# Patient Record
Sex: Female | Born: 1995 | Race: Black or African American | Hispanic: No | Marital: Single | Smoking: Never smoker
Health system: Southern US, Community
[De-identification: ages and names within clinical notes are randomized; demographics above are authoritative.]

## PROBLEM LIST (undated history)

## (undated) DIAGNOSIS — N939 Abnormal uterine and vaginal bleeding, unspecified: Secondary | ICD-10-CM

## (undated) DIAGNOSIS — R519 Headache, unspecified: Secondary | ICD-10-CM

## (undated) DIAGNOSIS — R51 Headache: Secondary | ICD-10-CM

## (undated) DIAGNOSIS — F419 Anxiety disorder, unspecified: Secondary | ICD-10-CM

## (undated) HISTORY — DX: Headache: R51

## (undated) HISTORY — DX: Abnormal uterine and vaginal bleeding, unspecified: N93.9

## (undated) HISTORY — DX: Headache, unspecified: R51.9

## (undated) HISTORY — DX: Anxiety disorder, unspecified: F41.9

---
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
## 2015-09-08 ENCOUNTER — Encounter: Payer: Self-pay | Admitting: Certified Nurse Midwife

## 2015-09-08 ENCOUNTER — Ambulatory Visit (INDEPENDENT_AMBULATORY_CARE_PROVIDER_SITE_OTHER): Payer: BLUE CROSS/BLUE SHIELD | Admitting: Certified Nurse Midwife

## 2015-09-08 VITALS — BP 112/64 | HR 84 | Temp 98.7°F | Resp 16 | Ht 67.5 in | Wt 294.4 lb

## 2015-09-08 DIAGNOSIS — N938 Other specified abnormal uterine and vaginal bleeding: Secondary | ICD-10-CM

## 2015-09-08 DIAGNOSIS — N912 Amenorrhea, unspecified: Secondary | ICD-10-CM | POA: Diagnosis not present

## 2015-09-08 DIAGNOSIS — E669 Obesity, unspecified: Secondary | ICD-10-CM | POA: Diagnosis not present

## 2015-09-08 DIAGNOSIS — Z01419 Encounter for gynecological examination (general) (routine) without abnormal findings: Secondary | ICD-10-CM | POA: Diagnosis not present

## 2015-09-08 DIAGNOSIS — Z0000 Encounter for general adult medical examination without abnormal findings: Secondary | ICD-10-CM | POA: Diagnosis not present

## 2015-09-08 LAB — POCT URINALYSIS DIP (MANUAL ENTRY)
Bilirubin, UA: NEGATIVE
Glucose, UA: NEGATIVE
Ketones, POC UA: NEGATIVE
Leukocytes, UA: NEGATIVE
Nitrite, UA: NEGATIVE
Protein Ur, POC: NEGATIVE
Urobilinogen, UA: NEGATIVE
pH, UA: 5

## 2015-09-08 NOTE — Patient Instructions (Signed)
General topics  Next pap or exam is  due in 1 year Take a Women's multivitamin Take 1200 mg. of calcium daily - prefer dietary If any concerns in interim to call back  Breast Self-Awareness Practicing breast self-awareness may pick up problems early, prevent significant medical complications, and possibly save your life. By practicing breast self-awareness, you can become familiar with how your breasts look and feel and if your breasts are changing. This allows you to notice changes early. It can also offer you some reassurance that your breast health is good. One way to learn what is normal for your breasts and whether your breasts are changing is to do a breast self-exam. If you find a lump or something that was not present in the past, it is best to contact your caregiver right away. Other findings that should be evaluated by your caregiver include nipple discharge, especially if it is bloody; skin changes or reddening; areas where the skin seems to be pulled in (retracted); or new lumps and bumps. Breast pain is seldom associated with cancer (malignancy), but should also be evaluated by a caregiver. BREAST SELF-EXAM The best time to examine your breasts is 5 7 days after your menstrual period is over.  ExitCare Patient Information 2013 [employer]Care[employer].   Exercise to Stay Healthy Exercise helps you become and stay healthy. EXERCISE IDEAS AND TIPS Choose exercises that:  You enjoy.  Fit into your day. You do not need to exercise really hard to be healthy. You can do exercises at a slow or medium level and stay healthy. You can:  Stretch before and after working out.  Try yoga, Pilates, or tai chi.  Lift weights.  Walk fast, swim, jog, run, climb stairs, bicycle, dance, or rollerskate.  Take aerobic classes. Exercises that burn about 150 calories:  Running 1  miles in 15 minutes.  Playing volleyball for 45 to 60 minutes.  Washing and waxing a car for 45 to 60  minutes.  Playing touch football for 45 minutes.  Walking 1  miles in 35 minutes.  Pushing a stroller 1  miles in 30 minutes.  Playing basketball for 30 minutes.  Raking leaves for 30 minutes.  Bicycling 5 miles in 30 minutes.  Walking 2 miles in 30 minutes.  Dancing for 30 minutes.  Shoveling snow for 15 minutes.  Swimming laps for 20 minutes.  Walking up stairs for 15 minutes.  Bicycling 4 miles in 15 minutes.  Gardening for 30 to 45 minutes.  Jumping rope for 15 minutes.  Washing windows or floors for 45 to 60 minutes. Document Released: 10/12/2010 Document Revised: 12/02/2011 Document Reviewed: 10/12/2010 ExitCare Patient Information 2013 ExitCare[employer].   Other topics ( that may be useful information):    Sexually Transmitted Disease Sexually transmitted disease (STD) refers to any infection that is passed from person to person during sexual activity. This may happen by way of saliva, semen, blood, vaginal mucus, or urine. Common STDs include:  Gonorrhea.  Chlamydia.  Syphilis.  HIV/AIDS.  Genital herpes.  Hepatitis B and C.  Trichomonas.  Human papillomavirus (HPV).  Pubic lice. CAUSES  An STD may be spread by bacteria, virus, or parasite. A person can get an STD by:  Sexual intercourse with an infected person.  Sharing sex toys with an infected person.  Sharing needles with an infected person.  Having intimate contact with the genitals, mouth, or rectal areas of an infected person. SYMPTOMS  Some people may not have any symptoms, but   they can still pass the infection to others. Different STDs have different symptoms. Symptoms include:  Painful or bloody urination.  Pain in the pelvis, abdomen, vagina, anus, throat, or eyes.  Skin rash, itching, irritation, growths, or sores (lesions). These usually occur in the genital or anal area.  Abnormal vaginal discharge.  Penile discharge in men.  Soft, flesh-colored skin growths in the  genital or anal area.  Fever.  Pain or bleeding during sexual intercourse.  Swollen glands in the groin area.  Yellow skin and eyes (jaundice). This is seen with hepatitis. DIAGNOSIS  To make a diagnosis, your caregiver may:  Take a medical history.  Perform a physical exam.  Take a specimen (culture) to be examined.  Examine a sample of discharge under a microscope.  Perform blood test TREATMENT   Chlamydia, gonorrhea, trichomonas, and syphilis can be cured with antibiotic medicine.  Genital herpes, hepatitis, and HIV can be treated, but not cured, with prescribed medicines. The medicines will lessen the symptoms.  Genital warts from HPV can be treated with medicine or by freezing, burning (electrocautery), or surgery. Warts may come back.  HPV is a virus and cannot be cured with medicine or surgery.However, abnormal areas may be followed very closely by your caregiver and may be removed from the cervix, vagina, or vulva through office procedures or surgery. If your diagnosis is confirmed, your recent sexual partners need treatment. This is true even if they are symptom-free or have a negative culture or evaluation. They should not have sex until their caregiver says it is okay. HOME CARE INSTRUCTIONS  All sexual partners should be informed, tested, and treated for all STDs.  Take your antibiotics as directed. Finish them even if you start to feel better.  Only take over-the-counter or prescription medicines for pain, discomfort, or fever as directed by your caregiver.  Rest.  Eat a balanced diet and drink enough fluids to keep your urine clear or pale yellow.  Do not have sex until treatment is completed and you have followed up with your caregiver. STDs should be checked after treatment.  Keep all follow-up appointments, Pap tests, and blood tests as directed by your caregiver.  Only use latex condoms and water-soluble lubricants during sexual activity. Do not use  petroleum jelly or oils.  Avoid alcohol and illegal drugs.  Get vaccinated for HPV and hepatitis. If you have not received these vaccines in the past, talk to your caregiver about whether one or both might be right for you.  Avoid risky sex practices that can break the skin. The only way to avoid getting an STD is to avoid all sexual activity.Latex condoms and dental dams (for oral sex) will help lessen the risk of getting an STD, but will not completely eliminate the risk. SEEK MEDICAL CARE IF:   You have a fever.  You have any new or worsening symptoms. Document Released: 11/30/2002 Document Revised: 12/02/2011 Document Reviewed: 12/07/2010 [facility] Patient Information 2013 [place].    Domestic Abuse You are being battered or abused if someone close to you hits, pushes, or physically hurts you in any way. You also are being abused if you are forced into activities. You are being sexually abused if you are forced to have sexual contact of any kind. You are being emotionally abused if you are made to feel worthless or if you are constantly threatened. It is important to remember that help is available. No one has the right to abuse you. PREVENTION OF FURTHER  ABUSE  Learn the warning signs of danger. This varies with situations but may include: the use of alcohol, threats, isolation from friends and family, or forced sexual contact. Leave if you feel that violence is going to occur.  If you are attacked or beaten, report it to the police so the abuse is documented. You do not have to press charges. The police can protect you while you or the attackers are leaving. Get the officer's name and badge number and a copy of the report.  Find someone you can trust and tell them what is happening to you: your caregiver, a nurse, clergy member, close friend or family member. Feeling ashamed is natural, but remember that you have done nothing wrong. No one deserves abuse. Document Released:  09/06/2000 Document Revised: 12/02/2011 Document Reviewed: 11/15/2010 ExitCare Patient Information 2013 [employer]Care[employer] LLC.    How Much is Too Much Alcohol? Drinking too much alcohol can cause injury, accidents, and health problems. These types of problems can include:   Car crashes.  Falls.  Family fighting (domestic violence).  Drowning.  Fights.  Injuries.  Burns.  Damage to certain organs.  Having a baby with birth defects. ONE DRINK CAN BE TOO MUCH WHEN YOU ARE:  Working.  Pregnant or breastfeeding.  Taking medicines. Ask your doctor.  Driving or planning to drive. If you or someone you know has a drinking problem, get help from a doctor.  Document Released: 07/06/2009 Document Revised: 12/02/2011 Document Reviewed: 07/06/2009 ExitCare Patient Information 2013 [employer]Care[employer].   Smoking Hazards Smoking cigarettes is extremely bad for your health. Tobacco smoke has over 200 known poisons in it. There are over 60 chemicals in tobacco smoke that cause cancer. Some of the chemicals found in cigarette smoke include:   Cyanide.  Benzene.  Formaldehyde.  Methanol (wood alcohol).  Acetylene (fuel used in welding torches).  Ammonia. Cigarette smoke also contains the poisonous gases nitrogen oxide and carbon monoxide.  Cigarette smokers have an increased risk of many serious medical problems and Smoking causes approximately:  90% of all lung cancer deaths in men.  80% of all lung cancer deaths in women.  90% of deaths from chronic obstructive lung disease. Compared with nonsmokers, smoking increases the risk of:  Coronary heart disease by 2 to 4 times.  Stroke by 2 to 4 times.  Men developing lung cancer by 23 times.  Women developing lung cancer by 13 times.  Dying from chronic obstructive lung diseases by 12 times.  . Smoking is the most preventable cause of death and disease in our society.  WHY IS SMOKING ADDICTIVE?  Nicotine is the chemical  agent in tobacco that is capable of causing addiction or dependence.  When you smoke and inhale, nicotine is absorbed rapidly into the bloodstream through your lungs. Nicotine absorbed through the lungs is capable of creating a powerful addiction. Both inhaled and non-inhaled nicotine may be addictive.  Addiction studies of cigarettes and spit tobacco show that addiction to nicotine occurs mainly during the teen years, when young people begin using tobacco products. WHAT ARE THE BENEFITS OF QUITTING?  There are many health benefits to quitting smoking.   Likelihood of developing cancer and heart disease decreases. Health improvements are seen almost immediately.  Blood pressure, pulse rate, and breathing patterns start returning to normal soon after quitting. QUITTING SMOKING   American Lung Association - 1-800-LUNGUSA  American Cancer Society - 1-800-ACS-2345 Document Released: 10/17/2004 Document Revised: 12/02/2011 Document Reviewed: 06/21/2009 ExitCare Patient Information 2013 ExitCare,   LLC.   Stress Management Stress is a state of physical or mental tension that often results from changes in your life or normal routine. Some common causes of stress are:  Death of a loved one.  Injuries or severe illnesses.  Getting fired or changing jobs.  Moving into a new home. Other causes may be:  Sexual problems.  Business or financial losses.  Taking on a large debt.  Regular conflict with someone at home or at work.  Constant tiredness from lack of sleep. It is not just bad things that are stressful. It may be stressful to:  Win the lottery.  Get married.  Buy a new car. The amount of stress that can be easily tolerated varies from person to person. Changes generally cause stress, regardless of the types of change. Too much stress can affect your health. It may lead to physical or emotional problems. Too little stress (boredom) may also become stressful. SUGGESTIONS TO  REDUCE STRESS:  Talk things over with your family and friends. It often is helpful to share your concerns and worries. If you feel your problem is serious, you may want to get help from a professional counselor.  Consider your problems one at a time instead of lumping them all together. Trying to take care of everything at once may seem impossible. List all the things you need to do and then start with the most important one. Set a goal to accomplish 2 or 3 things each day. If you expect to do too many in a single day you will naturally fail, causing you to feel even more stressed.  Do not use alcohol or drugs to relieve stress. Although you may feel better for a short time, they do not remove the problems that caused the stress. They can also be habit forming.  Exercise regularly - at least 3 times per week. Physical exercise can help to relieve that "uptight" feeling and will relax you.  The shortest distance between despair and hope is often a good night's sleep.  Go to bed and get up on time allowing yourself time for appointments without being rushed.  Take a short "time-out" period from any stressful situation that occurs during the day. Close your eyes and take some deep breaths. Starting with the muscles in your face, tense them, hold it for a few seconds, then relax. Repeat this with the muscles in your neck, shoulders, hand, stomach, back and legs.  Take good care of yourself. Eat a balanced diet and get plenty of rest.  Schedule time for having fun. Take a break from your daily routine to relax. HOME CARE INSTRUCTIONS   Call if you feel overwhelmed by your problems and feel you can no longer manage them on your own.  Return immediately if you feel like hurting yourself or someone else. Document Released: 03/05/2001 Document Revised: 12/02/2011 Document Reviewed: 10/26/2007 [facility] Patient Information 2013 [place].  Human Papillomavirus Quadrivalent Vaccine suspension  for injection What is this medicine? HUMAN PAPILLOMAVIRUS VACCINE (HYOO muhn pap uh LOH muh vahy ruhs vak SEEN) is a vaccine. It is used to prevent infections of four types of the human papillomavirus. In women, the vaccine may lower your risk of getting cervical, vaginal, vulvar, or anal cancer and genital warts. In men, the vaccine may lower your risk of getting genital warts and anal cancer. You cannot get these diseases from the vaccine. This vaccine does not treat these diseases. This medicine may be used for other purposes;  ask your health care provider or pharmacist if you have questions. What should I tell my health care provider before I take this medicine? They need to know if you have any of these conditions: -fever or infection -hemophilia -HIV infection or AIDS -immune system problems -low platelet count -an unusual reaction to Human Papillomavirus Vaccine, yeast, other medicines, foods, dyes, or preservatives -pregnant or trying to get pregnant -breast-feeding How should I use this medicine? This vaccine is for injection in a muscle on your upper arm or thigh. It is given by a health care professional. Dennis Bast will be observed for 15 minutes after each dose. Sometimes, fainting happens after the vaccine is given. You may be asked to sit or lie down during the 15 minutes. Three doses are given. The second dose is given 2 months after the first dose. The last dose is given 4 months after the second dose. A copy of a Vaccine Information Statement will be given before each vaccination. Read this sheet carefully each time. The sheet may change frequently. Talk to your pediatrician regarding the use of this medicine in children. While this drug may be prescribed for children as young as 32 years of age for selected conditions, precautions do apply. Overdosage: If you think you have taken too much of this medicine contact a poison control center or emergency room at once. NOTE: This medicine is  only for you. Do not share this medicine with others. What if I miss a dose? All 3 doses of the vaccine should be given within 6 months. Remember to keep appointments for follow-up doses. Your health care provider will tell you when to return for the next vaccine. Ask your health care professional for advice if you are unable to keep an appointment or miss a scheduled dose. What may interact with this medicine? -other vaccines This list may not describe all possible interactions. Give your health care provider a list of all the medicines, herbs, non-prescription drugs, or dietary supplements you use. Also tell them if you smoke, drink alcohol, or use illegal drugs. Some items may interact with your medicine. What should I watch for while using this medicine? This vaccine may not fully protect everyone. Continue to have regular pelvic exams and cervical or anal cancer screenings as directed by your doctor. The Human Papillomavirus is a sexually transmitted disease. It can be passed by any kind of sexual activity that involves genital contact. The vaccine works best when given before you have any contact with the virus. Many people who have the virus do not have any signs or symptoms. Tell your doctor or health care professional if you have any reaction or unusual symptom after getting the vaccine. What side effects may I notice from receiving this medicine? Side effects that you should report to your doctor or health care professional as soon as possible: -allergic reactions like skin rash, itching or hives, swelling of the face, lips, or tongue -breathing problems -feeling faint or lightheaded, falls Side effects that usually do not require medical attention (report to your doctor or health care professional if they continue or are bothersome): -cough -dizziness -fever -headache -nausea -redness, warmth, swelling, pain, or itching at site where injected This list may not describe all possible  side effects. Call your doctor for medical advice about side effects. You may report side effects to FDA at 1-800-FDA-1088. Where should I keep my medicine? This drug is given in a hospital or clinic and will not be stored at home.  NOTE: This sheet is a summary. It may not cover all possible information. If you have questions about this medicine, talk to your doctor, pharmacist, or health care provider.    2016, Elsevier/Gold Standard. (2013-11-01 13:14:33)  Great to meet you today. Have a good day Debbi

## 2015-09-08 NOTE — Progress Notes (Signed)
19 y.o. G0P0000 Single African American/Caucasian Fe here to establish gyn care and  for annual exam. Periods are normal every 2 months, light to moderate, no cramping. No for 3 months until 08/11/15 and had heavier bleeding and now on regular period. Patient has had history of anxiety with no medication. No problems with now. History of ? Stress headache at end of day and now resolving since out of high school. Mother in with patient, signed ROI. Patient verbalized agreement for mother in for conversation and any management and exam. Interested in contraceptive options for cycle control. Had used OCP previously, but had trouble remembering pills at times. Now in culinary school and busy.Never sexually active. Has regular aex with PCP yearly. No other health concerns today.   Patient's last menstrual period was 08/11/2015.          Sexually active: No. Never sexually active The current method of family planning is abstinence.    Exercising: No.  exercise Smoker:  no  Health Maintenance: Pap:  none MMG:  none Colonoscopy:  none BMD:  none TDaP:  Within 10 Shingles: n/a Pneumonia: n/a Hep C and HIV: none Labs: poct urine-small, Hgb-13.0 Self breast exam: done frequently   reports that she has never smoked. She does not have any smokeless tobacco history on file. She reports that she does not drink alcohol or use illicit drugs.  Past Medical History  Diagnosis Date  . Worsening headaches   . Abnormal uterine bleeding   . Anxiety     History reviewed. No pertinent past surgical history.  No current outpatient prescriptions on file.   No current facility-administered medications for this visit.    Family History  Problem Relation Age of Onset  . Hypertension Mother     ROS:  Pertinent items are noted in HPI.  Otherwise, a comprehensive ROS was negative.  Exam:   BP 112/64 mmHg  Pulse 84  Temp(Src) 98.7 F (37.1 C) (Oral)  Resp 16  Ht 5' 7.5" (1.715 m)  Wt 294 lb 6.4 oz  (133.539 kg)  BMI 45.40 kg/m2  LMP 08/11/2015 Height: 5' 7.5" (171.5 cm) Ht Readings from Last 3 Encounters:  09/08/15 5' 7.5" (1.715 m) (90 %*, Z = 1.27)  06/01/12 5\' 7"  (1.702 m) (88 %*, Z = 1.19)   * Growth percentiles are based on CDC 2-20 Years data.    General appearance: alert, cooperative and appears stated age Head: Normocephalic, without obvious abnormality, atraumatic Neck: no adenopathy, supple, symmetrical, trachea midline and thyroid normal to inspection and palpation Lungs: clear to auscultation bilaterally Breasts: normal appearance, no masses or tenderness, No nipple retraction or dimpling, No nipple discharge or bleeding, No axillary or supraclavicular adenopathy, large pendulous Heart: regular rate and rhythm Abdomen: soft, non-tender; no masses,  no organomegaly Extremities: extremities normal, atraumatic, no cyanosis or edema Skin: Skin color, texture, turgor normal. No rashes or lesions Lymph nodes: Cervical, supraclavicular, and axillary nodes normal. No abnormal inguinal nodes palpated Neurologic: Grossly normal   Pelvic: External genitalia:  no lesions              Urethra:  normal appearing urethra with no masses, tenderness or lesions              Bartholin's and Skene's: normal                 Vagina: normal appearing vagina with normal color and discharge, no lesions  Cervix: normal,non tender,no lesions              Pap taken: No. Bimanual Exam:  Uterus:  normal size, contour, position, consistency, mobility, non-tender and mid position              Adnexa: normal adnexa, no mass, fullness, tenderness and limited by body habitus, no large masses noted               Rectovaginal: Confirms               Anus:  normal appearance  Chaperone present: yes  A:  Well Woman with normal exam  Contraception not sexually active ever  DUB due to irregular/regular cycles, desires cycle control  Obesity  Gardasil candidate  P:   Reviewed health  and wellness pertinent to exam  Discussed irregular cycles may have heavier period due to length of time between periods and hormonal changes. Discussed weight, thyroid and pituitary influence of cycles and recommend labs. Patient will keep menses calendar and advise if changes.Patient agreeable to labs.  Lab: TSH, FSH,Prolactin  Discussed risks and benefits of contraceptive options of Depo Provera, Nexplanon, OCP,Nuvaring use for cycle management. Given information handout to review. Patient and mother would like to review all information of contraception and then advise choice. She will come in to discuss when ready.  Discussed health risks with overweight and encouraged regular exercise, portion control and limited fast food choices.  Discussed risks and benefits will decide and advise.  Pap smear as above not taken   counseled on breast self exam, STD prevention, HIV risk factors and prevention, family planning choices, adequate intake of calcium and vitamin D, diet and exercise  return annually or prn   An After Visit Summary was printed and given to the patient.

## 2015-09-09 LAB — TSH: TSH: 1.453 u[IU]/mL (ref 0.350–4.500)

## 2015-09-09 LAB — PROLACTIN: PROLACTIN: 7.7 ng/mL

## 2015-09-09 LAB — FOLLICLE STIMULATING HORMONE: FSH: 4.5 m[IU]/mL

## 2015-09-09 NOTE — Progress Notes (Signed)
Reviewed personally.  M. Suzanne [place] Severs, MD.  

## 2015-09-12 LAB — HEMOGLOBIN, FINGERSTICK: Hemoglobin, fingerstick: 12.7 g/dL (ref 12.0–16.0)

## 2015-09-19 ENCOUNTER — Telehealth: Payer: Self-pay | Admitting: Certified Nurse Midwife

## 2015-09-19 ENCOUNTER — Encounter (HOSPITAL_COMMUNITY): Payer: Self-pay

## 2015-09-19 ENCOUNTER — Inpatient Hospital Stay (HOSPITAL_COMMUNITY)
Admission: AD | Admit: 2015-09-19 | Discharge: 2015-09-19 | Disposition: A | Discharge status: Home / Self Care | Payer: BLUE CROSS/BLUE SHIELD | Source: Ambulatory Visit | Attending: Family Medicine | Admitting: Family Medicine

## 2015-09-19 DIAGNOSIS — N939 Abnormal uterine and vaginal bleeding, unspecified: Secondary | ICD-10-CM | POA: Diagnosis not present

## 2015-09-19 DIAGNOSIS — R42 Dizziness and giddiness: Secondary | ICD-10-CM | POA: Diagnosis not present

## 2015-09-19 DIAGNOSIS — F419 Anxiety disorder, unspecified: Secondary | ICD-10-CM | POA: Insufficient documentation

## 2015-09-19 LAB — CBC
HCT: 38 % (ref 36.0–46.0)
HEMOGLOBIN: 12.6 g/dL (ref 12.0–15.0)
MCH: 27.7 pg (ref 26.0–34.0)
MCHC: 33.2 g/dL (ref 30.0–36.0)
MCV: 83.5 fL (ref 78.0–100.0)
Platelets: 253 10*3/uL (ref 150–400)
RBC: 4.55 MIL/uL (ref 3.87–5.11)
RDW: 12.9 % (ref 11.5–15.5)
WBC: 9.7 10*3/uL (ref 4.0–10.5)

## 2015-09-19 LAB — POCT PREGNANCY, URINE: PREG TEST UR: NEGATIVE

## 2015-09-19 MED ORDER — NORGESTIMATE-ETH ESTRADIOL 0.25-35 MG-MCG PO TABS: 1.0000 | ORAL_TABLET | Freq: Every day | ORAL | Status: DC

## 2015-09-19 MED ORDER — IBUPROFEN 600 MG PO TABS: 600.0000 mg | ORAL_TABLET | Freq: Four times a day (QID) | ORAL | Status: DC | PRN

## 2015-09-19 NOTE — MAU Note (Signed)
Urine in lab 

## 2015-09-19 NOTE — Telephone Encounter (Signed)
I agree she needs to be seen. Please call and check on her tomorrow morning, since you can't get her in for a visit today.

## 2015-09-19 NOTE — Telephone Encounter (Signed)
Incoming call from patient. She states she spoke with her Mother about recommendations. Her Mother cannot drive her to office visit, she states that her Mother is going to bring [facility] after she gets off of work at 1700.  Patient denies any sob, chest pain, dizziness, fatigue, lightheadedness. She is advised to continue with Motrin 600 mg q 8 hours with food to help with cramps. Advised to call back as needed and will likely be instructed to call back [facility]. Patient and Mother were going to discuss starting on oral contraceptives for cycle control. She states she will call back for appointment. Patient is advised if any worsening symptoms or concerns to call back or call 911 as needed.  Routing to Dr. Oscar LaJertson for review prior to closing encounter.

## 2015-09-19 NOTE — Telephone Encounter (Signed)
Call to patient. She states she has been having heavy cycles and cramping since 12/17 after new GYN appointment with Leota Sauerseborah Leonard CNM. She states she has been having to use both a pad and tampon for coverage and changing approximately every 1-2 hours. Used Motrin for cramps this morning, helped with cramps per patient. Never sexually active. Advised with ongoing bleeding office visit recommended. Multiple days and times offered, patient declines. She does not have transportation. She will call her mother and discuss and will return call.

## 2015-09-19 NOTE — MAU Provider Note (Signed)
  History     CSN: [id number]  Arrival date and time: 09/19/15 1750   First Provider Initiated Contact with Patient 09/19/15 2055      Chief Complaint  Patient presents with  . Vaginal Bleeding   HPI Ms Angie Butler is a 19yo G0 who presents for eval secondary to having bleeding since 08/11/15. Her cycles were irreg prior to that; this cycle began heavier, changed to normal, and is now more recently heavy again. She reports changing her tampon/pad every few hours and describes feeling a little dizzy today. She had a gyn exam w/ Leota Sauerseborah Leonard CNM on 09/08/15 including labs. At that time was presented options for cycle control but has not made any decisions yet.  OB History    Gravida Para Term Preterm AB TAB SAB Ectopic Multiple Living   0 0 0 0 0 0 0 0 0 0       Past Medical History  Diagnosis Date  . Worsening headaches   . Abnormal uterine bleeding   . Anxiety     History reviewed. No pertinent past surgical history.  Family History  Problem Relation Age of Onset  . Hypertension Mother     Social History  Substance Use Topics  . Smoking status: Never Smoker   . Smokeless tobacco: None  . Alcohol Use: No    Allergies: No Known Allergies  Prescriptions prior to admission  Medication Sig Dispense Refill Last Dose  . [DISCONTINUED] ibuprofen (ADVIL,MOTRIN) 200 MG tablet Take 400 mg by mouth every 6 (six) hours as needed.   09/19/2015 at Unknown time    ROS Physical Exam   Blood pressure 121/65, pulse 96, temperature 98.2 F (36.8 C), temperature source Oral, resp. rate 18, weight 132.722 kg (292 lb 9.6 oz), last menstrual period 08/11/2015.  Physical Exam  Constitutional: She is oriented to person, place, and time. She appears well-developed.  Obese habitus  HENT:  Head: Normocephalic.  Neck: Normal range of motion.  Cardiovascular: Normal rate.   Respiratory: Effort normal.  GI: Soft.  Genitourinary:  SE: sm-mod amt of blood in vault  Musculoskeletal:  Normal range of motion.  Neurological: She is alert and oriented to person, place, and time.  Skin: Skin is warm and dry.  Psychiatric: She has a normal mood and affect. Her behavior is normal. Thought content normal.   UPT: neg  CBC    Component Value Date/Time   WBC 9.7 09/19/2015 1904   RBC 4.55 09/19/2015 1904   HGB 12.6 09/19/2015 1904   HCT 38.0 09/19/2015 1904   PLT 253 09/19/2015 1904   MCV 83.5 09/19/2015 1904   MCH 27.7 09/19/2015 1904   MCHC 33.2 09/19/2015 1904   RDW 12.9 09/19/2015 1904    MAU Course  Procedures  MDM UPT, CBC  Assessment and Plan  Abnl uterine bleeding but hemodynamically stable  D/C home Rx Sprintec 1 q 12 hrs until bleeding stops, then 1 qd Motrin 600 q 6hrs around the clock F/U w/ Ortencia Kick Leonard CNM regarding decision for long-term cycle management  Cam HaiSHAW, Danille Oppedisano CNM 09/19/2015, 9:11 PM

## 2015-09-19 NOTE — Discharge Instructions (Signed)

## 2015-09-19 NOTE — Telephone Encounter (Signed)
Patient is still having her period with a lot of cramping and pain.

## 2015-09-19 NOTE — MAU Note (Signed)
Just having really bleeding. Started Nov 18, initially was light and off/on. Became heavy right after clinic visit.

## 2015-09-20 NOTE — Telephone Encounter (Signed)
Spoke with patient. Patient states she was given Sprintec to help control her bleeding. She has not yet been able to pick this prescription up as the pharmacy was closed when she got out of the hospital. Will pick this up today. Is still experiencing heavy bleeding. Changing her pad/tampon every 2-3 hours. Denies any additional symptoms such as SOB, chest pain, or fatigue. Is having mild cramping that is control with taking Motrin 600 mg. Patient needs to check with her mother as she is her transportation for appointments. Will return call with days and times she is available to come in for follow up to schedule an appointment.

## 2015-09-20 NOTE — Telephone Encounter (Signed)
I read her note from MAU and was given Sprintec to treat bleeding. She was instructed to follow up with our office. She needs phone call to check on and schedule appt.

## 2015-09-21 NOTE — Telephone Encounter (Signed)
Spoke with patient's Mother, Efraim KaufmannMelissa, who is on designated party release form. She states that patient is sleeping and she wants to let patient sleep. She states that patient is still having bleeding and cramping and expresses frustration that she has had an ER visit and office visit with Leota Sauerseborah Leonard CNM and that the patient is still having bleeding and cramping. She states patient has been missing college classes and is not sleeping well due to pain and bleeding.  She states that Ghina has taken two doses of sprintec and is taking Motrin 600 mg as directed, she states that she started Sprintec last night. She states that she thinks that Iver is changing her pad/tampon q 3 hours, but thinks she may be changing her pad more often for cleanliness reasons. Advised that although I understand that Aubrey is sleeping, it is difficult to assess  and provide recommendations without speaking to her directly.  Expressed understanding of her frustration, but discussed that bleeding should really decrease with use of Sprintec, but that since she has only taken two doses it may take more time for bleeding to slow down and eventually stop.  Melissa states that when this has occurred for her she was given a medication and that bleeding stopped "almost immediately." Advised that I can only speak to her daughters care and that oral contraception has been reccommended for her as treatment.  Throughout conversation, offered office visit for further evaluation but Melissa declines. She states that our office hours are the same as her work schedule and that she is the patients transportation. She states that she has had to pay costs for office visit and ER visit and that she has missed work for her own personal illnesses.  Melissa wants to know if any further testing needs to be done or medication can be given to stop bleeding and if so, she can bring her for an appointment. If not, she states they will "wait it out."   Advised I will send a message to Dr. Hyacinth MeekerMiller review and provide any additional recommendations.

## 2015-09-21 NOTE — Telephone Encounter (Signed)
Patient's mom calling to speak with the nurse. She states she took her daughter to the emergency room on Tuesday for bleeding and they gave her birth control. This is not working and she wants to talk with DL.

## 2015-09-22 NOTE — Telephone Encounter (Signed)
Reviewed message with Dr. Hyacinth MeekerMiller.  Call to patient's Mother, Efraim KaufmannMelissa.  She states that patient today is much improved and feeling better. Offered to schedule follow up appointment and Mother declines at this time.  She states she will call back with schedule available for appointment with patient.  Routing to provider for final review. Patient agreeable to disposition. Will close encounter.

## 2016-01-10 ENCOUNTER — Encounter: Payer: Self-pay | Admitting: Physician Assistant

## 2016-01-10 ENCOUNTER — Ambulatory Visit (INDEPENDENT_AMBULATORY_CARE_PROVIDER_SITE_OTHER): Payer: BLUE CROSS/BLUE SHIELD | Admitting: Physician Assistant

## 2016-01-10 MED ORDER — PHENTERMINE HCL 37.5 MG PO TABS: 37.5000 mg | ORAL_TABLET | Freq: Every day | ORAL | Status: DC

## 2016-01-10 NOTE — Progress Notes (Signed)
   Subjective:    Patient ID: Angie Butler, female    DOB: Jan 31, 1996, 20 y.o.   MRN: [id number]  HPI Pt is a 20 yo female who presents to the clinic to establish care.   No PMH. .. Family History  Problem Relation Age of Onset  . Hypertension Mother    .Marland Kitchen. Social History   Social History  . Marital Status: Single    Spouse Name: N/A  . Number of Children: N/A  . Years of Education: N/A   Occupational History  . Not on file.   Social History Main Topics  . Smoking status: Never Smoker   . Smokeless tobacco: Not on file  . Alcohol Use: No  . Drug Use: No  . Sexual Activity: No   Other Topics Concern  . Not on file   Social History Narrative   Pt would like something to help her lose weight. Her mother has started phentermine and lost 25lbs. She would like to start. Currently not exercising or watching diet. She has tried diets in the past but only lost a few pounds.    Review of Systems  All other systems reviewed and are negative.      Objective:   Physical Exam  Constitutional: She is oriented to person, place, and time. She appears well-developed and well-nourished.  Morbid obese.  HENT:  Head: Normocephalic and atraumatic.  Neck: Normal range of motion. Neck supple. No thyromegaly present.  Cardiovascular: Normal rate, regular rhythm and normal heart sounds.   Pulmonary/Chest: Effort normal and breath sounds normal. She has no wheezes.  Neurological: She is alert and oriented to person, place, and time.  Psychiatric: She has a normal mood and affect. Her behavior is normal.          Assessment & Plan:  Morbid obesity- discussed diet and exercise. Will start phentermine discussed side effects. Start with 1/2 tablet daily. Follow up in 1 month nurse visit. Will check TSH.   Needs CPE in next few months with fasting labs. Declined Tdap today.

## 2016-02-07 ENCOUNTER — Ambulatory Visit: Payer: BLUE CROSS/BLUE SHIELD

## 2016-02-16 ENCOUNTER — Ambulatory Visit (INDEPENDENT_AMBULATORY_CARE_PROVIDER_SITE_OTHER): Payer: BLUE CROSS/BLUE SHIELD | Admitting: Physician Assistant

## 2016-02-16 VITALS — BP 106/70 | HR 105 | Wt 272.0 lb

## 2016-02-16 DIAGNOSIS — R635 Abnormal weight gain: Secondary | ICD-10-CM | POA: Diagnosis not present

## 2016-02-16 MED ORDER — PHENTERMINE HCL 37.5 MG PO TABS: 37.5000 mg | ORAL_TABLET | Freq: Every day | ORAL | Status: DC

## 2016-02-16 NOTE — Progress Notes (Signed)
Patient is here for blood pressure and weight check. Denies any trouble sleeping, palpitations, or any other medication problems. Patient has lost weight. A refill for Phentermine will be sent to patient preferred pharmacy. Patient advised to schedule a four week nurse visit and keep her upcoming appointment with her PCP. Verbalized understanding, no further questions. 

## 2016-04-19 ENCOUNTER — Encounter: Payer: Self-pay | Admitting: Physician Assistant

## 2016-04-19 ENCOUNTER — Ambulatory Visit (INDEPENDENT_AMBULATORY_CARE_PROVIDER_SITE_OTHER): Payer: BLUE CROSS/BLUE SHIELD | Admitting: Physician Assistant

## 2016-04-19 VITALS — BP 122/66 | HR 93 | Ht 66.0 in | Wt 277.0 lb

## 2016-04-19 DIAGNOSIS — Z23 Encounter for immunization: Secondary | ICD-10-CM | POA: Diagnosis not present

## 2016-04-19 DIAGNOSIS — L700 Acne vulgaris: Secondary | ICD-10-CM | POA: Diagnosis not present

## 2016-04-19 DIAGNOSIS — Z13220 Encounter for screening for lipoid disorders: Secondary | ICD-10-CM

## 2016-04-19 DIAGNOSIS — Z0000 Encounter for general adult medical examination without abnormal findings: Secondary | ICD-10-CM | POA: Diagnosis not present

## 2016-04-19 DIAGNOSIS — Z131 Encounter for screening for diabetes mellitus: Secondary | ICD-10-CM

## 2016-04-19 MED ORDER — BENZOYL PEROXIDE 5.25 % EX LIQD: 1.0000 "application " | Freq: Every day | CUTANEOUS | 5 refills | Status: DC

## 2016-04-19 MED ORDER — PHENTERMINE HCL 37.5 MG PO TABS: 37.5000 mg | ORAL_TABLET | Freq: Every day | ORAL | 0 refills | Status: DC

## 2016-04-19 NOTE — Progress Notes (Addendum)
Patient ID: Angie Butler, female   DOB: 17-Nov-1995, 20 y.o.   MRN: [id number]  Chief Complaint  Patient presents with  . Annual Exam  . Acne    HPI Angie Butler is a 20 y.o. female that present to clinic for complete physical exam. She has a few concerns. She desires something to help treat her acne. She also was started on phentermine about two months ago but has not been taking it for one month because she was not able to get the prescription filled. She wants to continue on the medications. She is making diet modifications and is joining a new gym with her sister and mother. She denies palpitations, sleeplessness, and chest pain.  HPI  Past Medical History:  Diagnosis Date  . Abnormal uterine bleeding   . Anxiety   . Worsening headaches     No past surgical history on file.  Family History  Problem Relation Age of Onset  . Hypertension Mother     Social History Social History  Substance Use Topics  . Smoking status: Never Smoker  . Smokeless tobacco: Not on file  . Alcohol use No    No Known Allergies  Current Outpatient Prescriptions  Medication Sig Dispense Refill  . ibuprofen (ADVIL,MOTRIN) 600 MG tablet Take 1 tablet (600 mg total) by mouth every 6 (six) hours as needed for cramping. 50 tablet 1  . norgestimate-ethinyl estradiol (SPRINTEC 28) 0.25-35 MG-MCG tablet Take 1 tablet by mouth daily. Take 1 tablet by mouth every 12 hours until bleeding stops, then take 1 tablet every day. 1 Package 3  . Benzoyl Peroxide 5.25 % LIQD Apply 1 application topically daily. 175 g 5  . phentermine (ADIPEX-P) 37.5 MG tablet Take 1 tablet (37.5 mg total) by mouth daily before breakfast. 30 tablet 0   No current facility-administered medications for this visit.     Review of Systems Review of Systems  All other systems reviewed and are negative.   Blood pressure 122/66, pulse 93, height 5\' 6"  (1.676 m), weight 277 lb (125.6 kg).  Physical Exam Physical Exam   Constitutional: She is oriented to person, place, and time. She appears well-developed and well-nourished.  obese  HENT:  Right Ear: Tympanic membrane, external ear and ear canal normal.  Left Ear: Tympanic membrane, external ear and ear canal normal.  Eyes: Conjunctivae are normal. Pupils are equal, round, and reactive to light.  Neck: Normal range of motion. Neck supple.  Cardiovascular: Normal rate, regular rhythm and normal heart sounds.   Pulmonary/Chest: Effort normal and breath sounds normal.  Abdominal: Soft. Bowel sounds are normal.  Musculoskeletal: Normal range of motion.  Neurological: She is alert and oriented to person, place, and time.  Skin: Skin is warm and dry.  Acanthosis nigricans noted on the back of the neck. Acne along the jaw line and back.   Psychiatric: She has a normal mood and affect. Her behavior is normal.    Assessment/Plan:    CPE- Screening lipid, CMP, and TSH were ordered today. Tetanus booster given today in office. Patient declined HIV testing.   Acne- Given Benziq wash to use daily. If no improvement, can consider referral to dermatology.   Obese- Refilled phentermine today. Continue with lifestyle modifications. Follow up in one month to check BP and weight loss.

## 2016-04-19 NOTE — Patient Instructions (Signed)
Start topical wash.  Refilled phentermine.

## 2016-05-17 ENCOUNTER — Ambulatory Visit: Payer: BLUE CROSS/BLUE SHIELD

## 2016-11-22 ENCOUNTER — Ambulatory Visit (INDEPENDENT_AMBULATORY_CARE_PROVIDER_SITE_OTHER): Payer: BLUE CROSS/BLUE SHIELD | Admitting: Physician Assistant

## 2016-11-22 ENCOUNTER — Encounter: Payer: Self-pay | Admitting: Physician Assistant

## 2016-11-22 DIAGNOSIS — J069 Acute upper respiratory infection, unspecified: Secondary | ICD-10-CM

## 2016-11-22 MED ORDER — IPRATROPIUM BROMIDE 0.06 % NA SOLN: 2.0000 | Freq: Four times a day (QID) | NASAL | 12 refills | Status: DC

## 2016-11-22 MED ORDER — PHENTERMINE HCL 37.5 MG PO TABS: 37.5000 mg | ORAL_TABLET | Freq: Every day | ORAL | 0 refills | Status: DC

## 2016-11-22 NOTE — Patient Instructions (Signed)
Nasal spray- atrovent Cough drops Tylenol cold sinus sever.    Upper Respiratory Infection, Adult Most upper respiratory infections (URIs) are caused by a virus. A URI affects the nose, throat, and upper air passages. The most common type of URI is often called "the common cold." Follow these instructions at home:  Take medicines only as told by your doctor.  Gargle warm saltwater or take cough drops to comfort your throat as told by your doctor.  Use a warm mist humidifier or inhale steam from a shower to increase air moisture. This may make it easier to breathe.  Drink enough fluid to keep your pee (urine) clear or pale yellow.  Eat soups and other clear broths.  Have a healthy diet.  Rest as needed.  Go back to work when your fever is gone or your doctor says it is okay.  You may need to stay home longer to avoid giving your URI to others.  You can also wear a face mask and wash your hands often to prevent spread of the virus.  Use your inhaler more if you have asthma.  Do not use any tobacco products, including cigarettes, chewing tobacco, or electronic cigarettes. If you need help quitting, ask your doctor. Contact a doctor if:  You are getting worse, not better.  Your symptoms are not helped by medicine.  You have chills.  You are getting more short of breath.  You have brown or red mucus.  You have yellow or brown discharge from your nose.  You have pain in your face, especially when you bend forward.  You have a fever.  You have puffy (swollen) neck glands.  You have pain while swallowing.  You have white areas in the back of your throat. Get help right away if:  You have very bad or constant:  Headache.  Ear pain.  Pain in your forehead, behind your eyes, and over your cheekbones (sinus pain).  Chest pain.  You have long-lasting (chronic) lung disease and any of the following:  Wheezing.  Long-lasting cough.  Coughing up blood.  A  change in your usual mucus.  You have a stiff neck.  You have changes in your:  Vision.  Hearing.  Thinking.  Mood. This information is not intended to replace advice given to you by your health care provider. Make sure you discuss any questions you have with your health care provider. Document Released: 02/26/2008 Document Revised: 05/12/2016 Document Reviewed: 12/15/2013 Elsevier Interactive Patient Education  2017 [employer]

## 2016-11-22 NOTE — Progress Notes (Addendum)
Subjective:     Patient ID: Angie PatiencePrecious Kriz, female   DOB: 20-Sep-1996, 21 y.o.   MRN: [id number]  HPI  This is a 21 year old female who presents for weight loss options. She has previously tried Phentermine (about 8 months ago). States she has started exercising again and has been eating healthier.   Also complains of sore throat, rhinorrhea, and productive cough for 2 weeks. States last night symptoms acutely worsened. Endorses sinus pressure, ear pain. Has not taken any OTC medications for the symptoms.  Review of Systems See HPI.    Objective:   Physical Exam  Constitutional: She is oriented to person, place, and time. She appears well-developed and well-nourished.  HENT:  Head: Normocephalic and atraumatic.  Right Ear: Tympanic membrane is erythematous.  Left Ear: Tympanic membrane is not erythematous.  Mouth/Throat: Oropharynx is clear and moist.  Some fluid noted behind the tympanic membranes.  Neck: Normal range of motion. Neck supple.  Cervical lymph nodes slightly tender.  Cardiovascular: Normal rate and regular rhythm.  Exam reveals no gallop and no friction rub.   No murmur heard. Pulmonary/Chest: Effort normal and breath sounds normal. She has no wheezes. She has no rales.  Musculoskeletal: Normal range of motion.  Lymphadenopathy:    She has cervical adenopathy.  Neurological: She is alert and oriented to person, place, and time.  Skin: Skin is warm and dry.  Psychiatric: She has a normal mood and affect. Her behavior is normal.       Assessment/Plan:  Diagnoses and all orders for this visit:  Morbid obesity (HCC) -     phentermine (ADIPEX-P) 37.5 MG tablet; Take 1 tablet (37.5 mg total) by mouth daily before breakfast.  Acute upper respiratory infection -     ipratropium (ATROVENT) 0.06 % nasal spray; Place 2 sprays into both nostrils 4 (four) times daily.  - Respiratory infection most likely viral given acute worsening of symptoms 1 day ago as well as recent  viral illness of family membre. Recommended hot steam baths and Tylenol cold and sinus for cold-like symptoms. Prescribed Ipratropium nasal sprays as needed for congestion. - Phentermine prescribed for patient. Educated on possible side effects including hypertension, anxiety, and dry mouth. Instructed patient to take this time to establish exercise and healthy eating habits. Follow up in 1 months.

## 2016-11-28 ENCOUNTER — Telehealth: Payer: Self-pay | Admitting: *Deleted

## 2016-11-28 NOTE — Telephone Encounter (Signed)
Pt's mom left vm that Angie Butler is feeling worse now than at last appt and that her ears are hurting "pretty bad" now as well.  Mom wanted to know if there is anything else you could give her.  Please advise.

## 2016-11-29 ENCOUNTER — Telehealth: Payer: Self-pay | Admitting: Physician Assistant

## 2016-11-29 ENCOUNTER — Other Ambulatory Visit: Payer: Self-pay | Admitting: Physician Assistant

## 2016-11-29 MED ORDER — AMOXICILLIN-POT CLAVULANATE 875-125 MG PO TABS: 1.0000 | ORAL_TABLET | Freq: Two times a day (BID) | ORAL | 0 refills | Status: DC

## 2016-11-29 NOTE — Telephone Encounter (Signed)
LMOM notifying pt of rx. 

## 2016-11-29 NOTE — Telephone Encounter (Signed)
Call and find out how doing? If improvement great. If not I will send back abx. Route back to me to decided appropriate one.

## 2016-11-29 NOTE — Telephone Encounter (Signed)
Sent augmentin for 10 days.  

## 2016-11-29 NOTE — Telephone Encounter (Signed)
LVM requesting pt to call the office.  

## 2016-11-29 NOTE — Telephone Encounter (Signed)
Patient's mom called request to get an [facility]otic [facility] [address] [phone]  Just this one time. Thanks

## 2016-12-23 ENCOUNTER — Ambulatory Visit: Payer: BLUE CROSS/BLUE SHIELD

## 2019-08-02 ENCOUNTER — Other Ambulatory Visit: Payer: Self-pay

## 2019-08-02 DIAGNOSIS — Z20822 Contact with and (suspected) exposure to covid-19: Secondary | ICD-10-CM

## 2019-08-03 LAB — NOVEL CORONAVIRUS, NAA: SARS-CoV-2, NAA: DETECTED — AB

## 2019-08-09 ENCOUNTER — Other Ambulatory Visit: Payer: Self-pay

## 2019-08-09 DIAGNOSIS — Z20822 Contact with and (suspected) exposure to covid-19: Secondary | ICD-10-CM

## 2019-08-10 LAB — NOVEL CORONAVIRUS, NAA: SARS-CoV-2, NAA: NOT DETECTED

## 2020-04-13 ENCOUNTER — Emergency Department
Admission: EM | Admit: 2020-04-13 | Discharge: 2020-04-13 | Disposition: A | Discharge status: Home / Self Care | Payer: BC Managed Care – PPO | Source: Home / Self Care

## 2020-04-13 ENCOUNTER — Other Ambulatory Visit: Payer: Self-pay

## 2020-04-13 ENCOUNTER — Emergency Department (INDEPENDENT_AMBULATORY_CARE_PROVIDER_SITE_OTHER): Payer: BC Managed Care – PPO

## 2020-04-13 DIAGNOSIS — R062 Wheezing: Secondary | ICD-10-CM

## 2020-04-13 DIAGNOSIS — R05 Cough: Secondary | ICD-10-CM | POA: Diagnosis not present

## 2020-04-13 DIAGNOSIS — R0989 Other specified symptoms and signs involving the circulatory and respiratory systems: Secondary | ICD-10-CM | POA: Diagnosis not present

## 2020-04-13 DIAGNOSIS — J069 Acute upper respiratory infection, unspecified: Secondary | ICD-10-CM | POA: Diagnosis not present

## 2020-04-13 MED ORDER — ONDANSETRON HCL 4 MG PO TABS: 4.0000 mg | ORAL_TABLET | Freq: Four times a day (QID) | ORAL | 0 refills | Status: DC

## 2020-04-13 MED ORDER — ALBUTEROL SULFATE HFA 108 (90 BASE) MCG/ACT IN AERS: 1.0000 | INHALATION_SPRAY | Freq: Four times a day (QID) | RESPIRATORY_TRACT | 0 refills | Status: AC | PRN

## 2020-04-13 MED ORDER — PREDNISONE 50 MG PO TABS: 50.0000 mg | ORAL_TABLET | Freq: Every day | ORAL | 0 refills | Status: DC

## 2020-04-13 NOTE — Discharge Instructions (Signed)
  You may take 500mg acetaminophen every 4-6 hours or in combination with ibuprofen 400-600mg every 6-8 hours as needed for pain, inflammation, and fever.  Be sure to well hydrated with clear liquids and get at least 8 hours of sleep at night, preferably more while sick.   Please follow up with family medicine in 1 week if needed.   

## 2020-04-13 NOTE — ED Triage Notes (Addendum)
Pt c/o cold sxs since Sunday. Sinus pressure, headache, some minor lower abd pain and sore throat. Low appetite and a few episodes of vomiting after eating. Very fatigued. Taking nyquil, sinus relief and claritin OTC prn with no relief. Also tried leftover antibiotics (doxycycline) from sisters previous sinus infection (on day 3). No known COVID exposure, had COVID Oct 2020. Has not had covid vaccinations.

## 2020-04-13 NOTE — ED Provider Notes (Signed)
[facility]    CSN: [id number] Arrival date & time: 04/13/20  0931      History   Chief Complaint Chief Complaint  Patient presents with  . Sore Throat  . Cough  . Nasal Congestion    Runny nose    HPI Angie Butler is a 24 y.o. female.   HPI  Angie Butler is a 24 y.o. female presenting to UC with c/o nasal congestion with sinus pressure, frontal headache, sore throat, generalized abdominal pain with vomiting after eating. She has taken Nyquil sinus relief, OTC Claritin, and started her sister's leftover doxycycline 3 days ago.  Vomiting started after she started the antibiotic.  Denies chest pain or SOB.  She had Covid in October 2020, she has not received the Covid vaccine.  Denies fever, chills, n/v/d.  No hx of asthma.    Past Medical History:  Diagnosis Date  . Abnormal uterine bleeding   . Anxiety   . Worsening headaches     Patient Active Problem List   Diagnosis Date Noted  . Morbid obesity (HCC) 01/10/2016    History reviewed. No pertinent surgical history.  OB History    Gravida  0   Para  0   Term  0   Preterm  0   AB  0   Living  0     SAB  0   TAB  0   Ectopic  0   Multiple  0   Live Births               Home Medications    Prior to Admission medications   Medication Sig Start Date End Date Taking? Authorizing Provider  albuterol (VENTOLIN HFA) 108 (90 Base) MCG/ACT inhaler Inhale 1-2 puffs into the lungs every 6 (six) hours as needed for wheezing or shortness of breath. 04/13/20   Lurene Shadow, PA-C  amoxicillin-clavulanate (AUGMENTIN) 875-125 MG tablet Take 1 tablet by mouth 2 (two) times daily. For 10 days. 11/29/16   Breeback, Lonna Cobb, PA-C  Benzoyl Peroxide 5.25 % LIQD Apply 1 application topically daily. 04/19/16   Breeback, Lonna Cobb, PA-C  ibuprofen (ADVIL,MOTRIN) 600 MG tablet Take 1 tablet (600 mg total) by mouth every 6 (six) hours as needed for cramping. 09/19/15   Cam Hai D, CNM  ipratropium  (ATROVENT) 0.06 % nasal spray Place 2 sprays into both nostrils 4 (four) times daily. 11/22/16   Breeback, Lonna Cobb, PA-C  norgestimate-ethinyl estradiol (SPRINTEC 28) 0.25-35 MG-MCG tablet Take 1 tablet by mouth daily. Take 1 tablet by mouth every 12 hours until bleeding stops, then take 1 tablet every day. 09/19/15   Arabella Merles, CNM  ondansetron (ZOFRAN) 4 MG tablet Take 1 tablet (4 mg total) by mouth every 6 (six) hours. 04/13/20   Lurene Shadow, PA-C  phentermine (ADIPEX-P) 37.5 MG tablet Take 1 tablet (37.5 mg total) by mouth daily before breakfast. 11/22/16   Breeback, Jade L, PA-C  predniSONE (DELTASONE) 50 MG tablet Take 1 tablet (50 mg total) by mouth daily with breakfast for 5 days. 04/13/20 04/18/20  Lurene Shadow, PA-C    Family History Family History  Problem Relation Age of Onset  . Hypertension Mother     Social History Social History   Tobacco Use  . Smoking status: Never Smoker  . Smokeless tobacco: Never Used  Vaping Use  . Vaping Use: Never used  Substance Use Topics  . Alcohol use: No    Alcohol/week: 0.0 standard  drinks  . Drug use: No     Allergies   Patient has no known allergies.   Review of Systems Review of Systems  Constitutional: Negative for chills and fever.  HENT: Positive for congestion, rhinorrhea, sinus pressure and sinus pain. Negative for ear pain, sore throat, trouble swallowing and voice change.   Respiratory: Positive for cough. Negative for shortness of breath.   Cardiovascular: Negative for chest pain and palpitations.  Gastrointestinal: Positive for abdominal pain, nausea and vomiting. Negative for diarrhea.  Musculoskeletal: Negative for arthralgias, back pain and myalgias.  Skin: Negative for rash.  Neurological: Positive for headaches. Negative for dizziness and light-headedness.  All other systems reviewed and are negative.    Physical Exam Triage Vital Signs ED Triage Vitals [04/13/20 0950]  Enc Vitals Group     BP 115/78      Pulse Rate 95     Resp 18     Temp 98.6 F (37 C)     Temp Source Oral     SpO2 96 %     Weight      Height      Head Circumference      Peak Flow      Pain Score 0     Pain Loc      Pain Edu?      Excl. in GC?    No data found.  Updated Vital Signs BP 115/78 (BP Location: Left Arm)   Pulse 95   Temp 98.6 F (37 C) (Oral)   Resp 18   LMP 03/20/2020 (Approximate)   SpO2 96%   Visual Acuity Right Eye Distance:   Left Eye Distance:   Bilateral Distance:    Right Eye Near:   Left Eye Near:    Bilateral Near:     Physical Exam Vitals and nursing note reviewed.  Constitutional:      General: She is not in acute distress.    Appearance: She is well-developed. She is not ill-appearing, toxic-appearing or diaphoretic.  HENT:     Head: Normocephalic and atraumatic.     Right Ear: Tympanic membrane and ear canal normal.     Left Ear: Tympanic membrane and ear canal normal.     Nose: Nose normal.     Right Sinus: No maxillary sinus tenderness or frontal sinus tenderness.     Left Sinus: No maxillary sinus tenderness or frontal sinus tenderness.     Mouth/Throat:     Lips: Pink.     Mouth: Mucous membranes are moist.     Pharynx: Oropharynx is clear. Uvula midline.  Cardiovascular:     Rate and Rhythm: Normal rate and regular rhythm.  Pulmonary:     Effort: Pulmonary effort is normal. No respiratory distress.     Breath sounds: No stridor. Wheezing (Right lower lung field) present. No rhonchi or rales.  Musculoskeletal:        General: Normal range of motion.     Cervical back: Normal range of motion and neck supple.  Lymphadenopathy:     Cervical: No cervical adenopathy.  Skin:    General: Skin is warm and dry.  Neurological:     Mental Status: She is alert and oriented to person, place, and time.  Psychiatric:        Behavior: Behavior normal.      UC Treatments / Results  Labs (all labs ordered are listed, but only abnormal results are  displayed) Labs Reviewed  SARS-COV-2 RNA,(COVID-19) QUALITATIVE NAAT  EKG   Radiology DG Chest 2 View  Result Date: 04/13/2020 CLINICAL DATA:  Cough, congestion EXAM: CHEST - 2 VIEW COMPARISON:  None. FINDINGS: The heart size and mediastinal contours are within normal limits. Both lungs are clear. The visualized skeletal structures are unremarkable. IMPRESSION: No active cardiopulmonary disease. Electronically Signed   By: Duanne Guess D.O.   On: 04/13/2020 10:29    Procedures Procedures (including critical care time)  Medications Ordered in UC Medications - No data to display  Initial Impression / Assessment and Plan / UC Course  I have reviewed the triage vital signs and the nursing notes.  Pertinent labs & imaging results that were available during my care of the patient were reviewed by me and considered in my medical decision making (see chart for details).     Reviewed imaging with pt Reassured her no evidence of pneumonia.  Encouraged symptomatic tx for viral URI Covid PCR test- pending AVS given  Final Clinical Impressions(s) / UC Diagnoses   Final diagnoses:  Viral URI with cough  Wheeze     Discharge Instructions      You may take 500mg  acetaminophen every 4-6 hours or in combination with ibuprofen 400-600mg  every 6-8 hours as needed for pain, inflammation, and fever.  Be sure to well hydrated with clear liquids and get at least 8 hours of sleep at night, preferably more while sick.   Please follow up with family medicine in 1 week if needed.     ED Prescriptions    Medication Sig Dispense Auth. Provider   albuterol (VENTOLIN HFA) 108 (90 Base) MCG/ACT inhaler Inhale 1-2 puffs into the lungs every 6 (six) hours as needed for wheezing or shortness of breath. 8 g , Takerra Lupinacci O, PA-C   predniSONE (DELTASONE) 50 MG tablet Take 1 tablet (50 mg total) by mouth daily with breakfast for 5 days. 5 tablet 04-[birth year]-1982, Colbi Schiltz O, PA-C   ondansetron (ZOFRAN) 4 MG  tablet Take 1 tablet (4 mg total) by mouth every 6 (six) hours. 12 tablet Doroteo Glassman, Lurene Shadow     PDMP not reviewed this encounter.   [place], Lurene Shadow 04/13/20 1219

## 2020-04-15 LAB — SARS-COV-2 RNA,(COVID-19) QUALITATIVE NAAT: SARS CoV2 RNA: NOT DETECTED

## 2020-04-17 ENCOUNTER — Other Ambulatory Visit: Payer: Self-pay

## 2020-04-17 ENCOUNTER — Ambulatory Visit (INDEPENDENT_AMBULATORY_CARE_PROVIDER_SITE_OTHER): Payer: BC Managed Care – PPO | Admitting: Medical-Surgical

## 2020-04-17 ENCOUNTER — Encounter: Payer: Self-pay | Admitting: Medical-Surgical

## 2020-04-17 VITALS — BP 114/77 | HR 76 | Temp 98.4°F | Ht 66.0 in | Wt 287.0 lb

## 2020-04-17 DIAGNOSIS — Z0000 Encounter for general adult medical examination without abnormal findings: Secondary | ICD-10-CM | POA: Diagnosis not present

## 2020-04-17 DIAGNOSIS — Z7689 Persons encountering health services in other specified circumstances: Secondary | ICD-10-CM

## 2020-04-17 DIAGNOSIS — F419 Anxiety disorder, unspecified: Secondary | ICD-10-CM | POA: Diagnosis not present

## 2020-04-17 NOTE — Progress Notes (Signed)
New Patient Office Visit  Subjective:  Patient ID: Angie Butler, female    DOB: 02-Mar-1996  Age: 24 y.o. MRN: [id number]  CC:  Chief Complaint  Patient presents with  . New Patient (Initial Visit)    HPI Angie Butler is a pleasant 24 year old female presenting to establish care.   Recent URI, tail end, feeling much better.  Stopped drinking sodas and now not having headaches like she used to.  Anxiety- able to control some of the anxiety. Tends to be an over-thinker worrying about all the bad things that can happen.  Morbid obesity- not exercising at this time but knows she should start. No current weight loss measures.    Past Medical History:  Diagnosis Date  . Abnormal uterine bleeding   . Anxiety   . Worsening headaches     No past surgical history on file.  Family History  Problem Relation Age of Onset  . Hypertension Mother     Social History   Socioeconomic History  . Marital status: Unknown    Spouse name: Not on file  . Number of children: Not on file  . Years of education: Not on file  . Highest education level: Not on file  Occupational History  . Not on file  Tobacco Use  . Smoking status: Never Smoker  . Smokeless tobacco: Never Used  Vaping Use  . Vaping Use: Never used  Substance and Sexual Activity  . Alcohol use: No    Alcohol/week: 0.0 standard drinks  . Drug use: No  . Sexual activity: Never    Partners: Male    Birth control/protection: Abstinence  Other Topics Concern  . Not on file  Social History Narrative  . Not on file   Social Determinants of Health   Financial Resource Strain:   . Difficulty of Paying Living Expenses:   Food Insecurity:   . Worried About Programme researcher, broadcasting/film/video in the Last Year:   . Barista in the Last Year:   Transportation Needs:   . Freight forwarder (Medical):   Marland Kitchen Lack of Transportation (Non-Medical):   Physical Activity:   . Days of Exercise per Week:   . Minutes of Exercise per  Session:   Stress:   . Feeling of Stress :   Social Connections:   . Frequency of Communication with Friends and Family:   . Frequency of Social Gatherings with Friends and Family:   . Attends Religious Services:   . Active Member of Clubs or Organizations:   . Attends Banker Meetings:   Marland Kitchen Marital Status:   Intimate Partner Violence:   . Fear of Current or Ex-Partner:   . Emotionally Abused:   Marland Kitchen Physically Abused:   . Sexually Abused:     ROS Review of Systems  Constitutional: Negative for chills, fatigue, fever and unexpected weight change.  Respiratory: Negative for cough, chest tightness, shortness of breath and wheezing.   Cardiovascular: Negative for chest pain, palpitations and leg swelling.  Gastrointestinal: Negative for abdominal pain, constipation, diarrhea, nausea and vomiting.  Genitourinary: Negative for dysuria, frequency and urgency.  Neurological: Positive for dizziness (occasional) and headaches (occasional).  Psychiatric/Behavioral: Negative for dysphoric mood, self-injury, sleep disturbance and suicidal ideas. The patient is not nervous/anxious.     Objective:   Today's Vitals: BP 114/77   Pulse 76   Temp 98.4 F (36.9 C) (Oral)   Ht 5\' 6"  (1.676 m)   Wt (!) 287 lb (130.2 kg)  LMP 03/20/2020 (Approximate)   BMI 46.32 kg/m   Physical Exam Vitals reviewed.  Constitutional:      General: She is not in acute distress.    Appearance: Normal appearance.  HENT:     Head: Normocephalic and atraumatic.  Cardiovascular:     Rate and Rhythm: Normal rate and regular rhythm.     Pulses: Normal pulses.     Heart sounds: Normal heart sounds. No murmur heard.  No friction rub. No gallop.   Pulmonary:     Effort: Pulmonary effort is normal. No respiratory distress.     Breath sounds: Normal breath sounds. No wheezing.  Skin:    General: Skin is warm and dry.  Neurological:     Mental Status: She is alert and oriented to person, place, and  time.  Psychiatric:        Mood and Affect: Mood normal.        Behavior: Behavior normal.        Thought Content: Thought content normal.        Judgment: Judgment normal.     Assessment & Plan:   1. Encounter to establish care Reviewed available medical records and discussed health care concerns.  Patient is due for annual physical exam with labs as well as Pap smear.  2. Anxiety Stable.  Managed with behavioral modification.  If she feels like she would like some assistance with managing her anxiety, will be happy to discuss maintenance medications or refer to behavioral health.  3. Morbid obesity due to excess calories (HCC) Checking hemoglobin A1c. - Hemoglobin A1c  4. Preventative health care Orders entered for upcoming physical exam.  No preventative health care provided at this visit today. - CBC - COMPLETE METABOLIC PANEL WITH GFR - Lipid panel   Outpatient Encounter Medications as of 04/17/2020  Medication Sig  . albuterol (VENTOLIN HFA) 108 (90 Base) MCG/ACT inhaler Inhale 1-2 puffs into the lungs every 6 (six) hours as needed for wheezing or shortness of breath.  Marland Kitchen ibuprofen (ADVIL,MOTRIN) 600 MG tablet Take 1 tablet (600 mg total) by mouth every 6 (six) hours as needed for cramping.  Marland Kitchen ipratropium (ATROVENT) 0.06 % nasal spray Place 2 sprays into both nostrils 4 (four) times daily.  . ondansetron (ZOFRAN) 4 MG tablet Take 1 tablet (4 mg total) by mouth every 6 (six) hours.  . [DISCONTINUED] amoxicillin-clavulanate (AUGMENTIN) 875-125 MG tablet Take 1 tablet by mouth 2 (two) times daily. For 10 days. (Patient not taking: Reported on 04/17/2020)  . [DISCONTINUED] Benzoyl Peroxide 5.25 % LIQD Apply 1 application topically daily. (Patient not taking: Reported on 04/17/2020)  . [DISCONTINUED] norgestimate-ethinyl estradiol (SPRINTEC 28) 0.25-35 MG-MCG tablet Take 1 tablet by mouth daily. Take 1 tablet by mouth every 12 hours until bleeding stops, then take 1 tablet every day.  (Patient not taking: Reported on 04/17/2020)  . [DISCONTINUED] phentermine (ADIPEX-P) 37.5 MG tablet Take 1 tablet (37.5 mg total) by mouth daily before breakfast. (Patient not taking: Reported on 04/17/2020)  . [DISCONTINUED] predniSONE (DELTASONE) 50 MG tablet Take 1 tablet (50 mg total) by mouth daily with breakfast for 5 days. (Patient not taking: Reported on 04/17/2020)   No facility-administered encounter medications on file as of 04/17/2020.    Follow-up: Return for CPE with pap at your convenience.   Thayer Ohm, DNP, APRN, FNP-BC [facility] and Sports Medicine

## 2020-11-27 ENCOUNTER — Ambulatory Visit (INDEPENDENT_AMBULATORY_CARE_PROVIDER_SITE_OTHER): Payer: BC Managed Care – PPO

## 2020-11-27 ENCOUNTER — Encounter: Payer: Self-pay | Admitting: Medical-Surgical

## 2020-11-27 ENCOUNTER — Other Ambulatory Visit: Payer: Self-pay

## 2020-11-27 ENCOUNTER — Ambulatory Visit (INDEPENDENT_AMBULATORY_CARE_PROVIDER_SITE_OTHER): Payer: BC Managed Care – PPO | Admitting: Medical-Surgical

## 2020-11-27 VITALS — BP 115/75 | HR 98 | Temp 98.8°F | Ht 66.0 in | Wt 274.7 lb

## 2020-11-27 DIAGNOSIS — B351 Tinea unguium: Secondary | ICD-10-CM

## 2020-11-27 DIAGNOSIS — M79672 Pain in left foot: Secondary | ICD-10-CM | POA: Diagnosis not present

## 2020-11-27 MED ORDER — MELOXICAM 15 MG PO TABS
15.0000 mg | ORAL_TABLET | Freq: Every day | ORAL | 0 refills | Status: DC
Start: 2020-11-27 — End: 2021-06-16

## 2020-11-27 NOTE — Progress Notes (Signed)
Subjective:    CC: Left foot pain  HPI: Pleasant 25 year old female presenting today with complaints of several months of left foot pain that involves the heel with radiation into the posterior ankle.  Pain is intermittent, worse in the morning with the first few steps.  She does work a sedentary job and notes the pain intermittently through the day when she stands up after sitting for long periods.  When out and about, she usually wears supportive shoes such as sneakers.  When working or sitting at home, she does like to sit around in socks and does not wear shoes regularly.  I reviewed the past medical history, family history, social history, surgical history, and allergies today and no changes were needed.  Please see the problem list section below in epic for further details.  Past Medical History: Past Medical History:  Diagnosis Date  . Abnormal uterine bleeding   . Anxiety   . Worsening headaches    Past Surgical History: History reviewed. No pertinent surgical history. Social History: Social History   Socioeconomic History  . Marital status: Unknown    Spouse name: Not on file  . Number of children: Not on file  . Years of education: Not on file  . Highest education level: Not on file  Occupational History  . Not on file  Tobacco Use  . Smoking status: Never Smoker  . Smokeless tobacco: Never Used  Vaping Use  . Vaping Use: Never used  Substance and Sexual Activity  . Alcohol use: No    Alcohol/week: 0.0 standard drinks  . Drug use: No  . Sexual activity: Never    Partners: Male    Birth control/protection: Abstinence  Other Topics Concern  . Not on file  Social History Narrative  . Not on file   Social Determinants of Health   Financial Resource Strain: Not on file  Food Insecurity: Not on file  Transportation Needs: Not on file  Physical Activity: Not on file  Stress: Not on file  Social Connections: Not on file   Family History: Family History   Problem Relation Age of Onset  . Hypertension Mother    Allergies: No Known Allergies Medications: See med rec.  Review of Systems: See HPI for pertinent positives and negatives.   Objective:    General: Well Developed, well nourished, and in no acute distress.  Neuro: Alert and oriented x3.  HEENT: Normocephalic, atraumatic.  Skin: Warm and dry. Cardiac: Regular rate and rhythm, no murmurs rubs or gallops, no lower extremity edema.  Respiratory: Clear to auscultation bilaterally. Not using accessory muscles, speaking in full sentences. Left foot: Full range of motion to ankle, midfoot and forefoot.  Tender to palpation over the left heel and posterior/ lateral calcaneus.  Great toe and third toe toenails with active fungal infection.  Impression and Recommendations:    1. Foot pain, left Getting left foot x-rays today.  Symptoms consistent with plantar fasciitis.  Recommend supportive shoes even when sitting right at home.  Heel lifts given for use if needed.  Reviewed stretches and the use of a frozen water bottle or tennis ball to help with symptoms.  Starting meloxicam 15 mg daily.  If these measures are not helpful, consider formal physical therapy or follow-up with Dr. Karie Schwalbe for further evaluation.  - DG Foot Complete Left; Future  2. Onychomycosis Discussed causes and recommended treatment of onychomycosis.  We would need to know her liver function prior to starting terbinafine and she would like  to hold off on this today until we figure out what is going on with her left foot pain.  Return if symptoms worsen or fail to improve. ___________________________________________ Thayer Ohm, DNP, APRN, FNP-BC Primary [facility] and Sports Medicine [facility] [place]

## 2020-11-27 NOTE — Patient Instructions (Signed)
Plantar Fasciitis  Plantar fasciitis is a painful foot condition that affects the heel. It occurs when the band of tissue that connects the toes to the heel bone (plantar fascia) becomes irritated. This can happen as the result of exercising too much or doing other repetitive activities (overuse injury). Plantar fasciitis can cause mild irritation to severe pain that makes it difficult to walk or move. The pain is usually worse in the morning after sleeping, or after sitting or lying down for a period of time. Pain may also be worse after long periods of walking or standing. What are the causes? This condition may be caused by:  Standing for long periods of time.  Wearing shoes that do not have good arch support.  Doing activities that put stress on joints (high-impact activities). This includes ballet and exercise that makes your heart beat faster (aerobic exercise), such as running.  Being overweight.  An abnormal way of walking (gait).  Tight muscles in the back of your lower leg (calf).  High arches in your feet or flat feet.  Starting a new athletic activity. What are the signs or symptoms? The main symptom of this condition is heel pain. Pain may get worse after the following:  Taking the first steps after a time of rest, especially in the morning after awakening, or after you have been sitting or lying down for a while.  Long periods of standing still. Pain may decrease after 30-45 minutes of activity, such as gentle walking. How is this diagnosed? This condition may be diagnosed based on your medical history, a physical exam, and your symptoms. Your health care provider will check for:  A tender area on the bottom of your foot.  A high arch in your foot or flat feet.  Pain when you move your foot.  Difficulty moving your foot. You may have imaging tests to confirm the diagnosis, such as:  X-rays.  Ultrasound.  MRI. How is this treated? Treatment for plantar  fasciitis depends on how severe your condition is. Treatment may include:  Rest, ice, pressure (compression), and raising (elevating) the affected foot. This is called RICE therapy. Your health care provider may recommend RICE therapy along with over-the-counter pain medicines to manage your pain.  Exercises to stretch your calves and your plantar fascia.  A splint that holds your foot in a stretched, upward position while you sleep (night splint).  Physical therapy to relieve symptoms and prevent problems in the future.  Injections of steroid medicine (cortisone) to relieve pain and inflammation.  Stimulating your plantar fascia with electrical impulses (extracorporeal shock wave therapy). This is usually the last treatment option before surgery.  Surgery, if other treatments have not worked after 12 months. Follow these instructions at home: Managing pain, stiffness, and swelling  If directed, put ice on the painful area. To do this: ? Put ice in a plastic bag, or use a frozen bottle of water. ? Place a towel between your skin and the bag or bottle. ? Roll the bottom of your foot over the bag or bottle. ? Do this for 20 minutes, 2-3 times a day.  Wear athletic shoes that have air-sole or gel-sole cushions, or try soft shoe inserts that are designed for plantar fasciitis.  Elevate your foot above the level of your heart while you are sitting or lying down.   Activity  Avoid activities that cause pain. Ask your health care provider what activities are safe for you.  Do physical therapy exercises   and stretches as told by your health care provider.  Try activities and forms of exercise that are easier on your joints (low impact). Examples include swimming, water aerobics, and biking. General instructions  Take over-the-counter and prescription medicines only as told by your health care provider.  Wear a night splint while sleeping, if told by your health care provider. Loosen the  splint if your toes tingle, become numb, or turn cold and blue.  Maintain a healthy weight, or work with your health care provider to lose weight as needed.  Keep all follow-up visits. This is important. Contact a health care provider if you have:  Symptoms that do not go away with home treatment.  Pain that gets worse.  Pain that affects your ability to move or do daily activities. Summary  Plantar fasciitis is a painful foot condition that affects the heel. It occurs when the band of tissue that connects the toes to the heel bone (plantar fascia) becomes irritated.  Heel pain is the main symptom of this condition. It may get worse after exercising too much or standing still for a long time.  Treatment varies, but it usually starts with rest, ice, pressure (compression), and raising (elevating) the affected foot. This is called RICE therapy. Over-the-counter medicines can also be used to manage pain. This information is not intended to replace advice given to you by your health care provider. Make sure you discuss any questions you have with your health care provider. Document Revised: 12/27/2019 Document Reviewed: 12/27/2019 Elsevier Patient Education  2021 [employer]  

## 2021-06-16 ENCOUNTER — Emergency Department (HOSPITAL_BASED_OUTPATIENT_CLINIC_OR_DEPARTMENT_OTHER)
Admission: EM | Admit: 2021-06-16 | Discharge: 2021-06-16 | Disposition: A | Discharge status: Home / Self Care | Payer: BC Managed Care – PPO | Attending: Emergency Medicine | Admitting: Emergency Medicine

## 2021-06-16 ENCOUNTER — Other Ambulatory Visit: Payer: Self-pay

## 2021-06-16 ENCOUNTER — Encounter (HOSPITAL_BASED_OUTPATIENT_CLINIC_OR_DEPARTMENT_OTHER): Payer: Self-pay | Admitting: *Deleted

## 2021-06-16 DIAGNOSIS — M549 Dorsalgia, unspecified: Secondary | ICD-10-CM | POA: Diagnosis present

## 2021-06-16 DIAGNOSIS — W108XXA Fall (on) (from) other stairs and steps, initial encounter: Secondary | ICD-10-CM | POA: Insufficient documentation

## 2021-06-16 DIAGNOSIS — M545 Low back pain, unspecified: Secondary | ICD-10-CM

## 2021-06-16 MED ORDER — METHOCARBAMOL 500 MG PO TABS: 500.0000 mg | ORAL_TABLET | Freq: Two times a day (BID) | ORAL | 0 refills | Status: AC

## 2021-06-16 MED ORDER — MELOXICAM 15 MG PO TABS: 15.0000 mg | ORAL_TABLET | Freq: Every day | ORAL | 0 refills | Status: AC

## 2021-06-16 MED ORDER — IBUPROFEN 800 MG PO TABS
800.0000 mg | ORAL_TABLET | Freq: Once | ORAL | Status: AC
  Administered 2021-06-16: 800 mg via ORAL
  Filled 2021-06-16: qty 1

## 2021-06-16 MED ORDER — ACETAMINOPHEN 325 MG PO TABS
650.0000 mg | ORAL_TABLET | Freq: Once | ORAL | Status: AC
  Administered 2021-06-16: 650 mg via ORAL
  Filled 2021-06-16: qty 2

## 2021-06-16 NOTE — ED Provider Notes (Signed)
[facility] EMERGENCY DEPARTMENT Provider Note   CSN: [id number] Arrival date & time: 06/16/21  1757     History Chief Complaint  Patient presents with   Back Pain    Angie Butler is a 25 y.o. female with no significant past medical history who reports that she had a fall down the stairs 2 weeks ago.  Patient reports worsening right-sided back pain since this incident.  Patient reports she has tried Tylenol a few times with minimal relief.  Patient has not tried ibuprofen or other pain medication.  Patient denies any numbness or tingling.  Patient reports that she is able to ambulate without difficulty with some pain.  Patient reports that she is not having some limp associated with the back pain.  Pain is worse with sitting, rising from sitting, relieved with standing.  Patient reports her pain is 20 out of 10 at time of evaluation.  Patient is not diabetic, denies chronic corticosteroid use, denies fever, denies IV drug use, denies pain waking her up from sleeping.   Back Pain     Past Medical History:  Diagnosis Date   Abnormal uterine bleeding    Anxiety    Worsening headaches     Patient Active Problem List   Diagnosis Date Noted   Anxiety 04/17/2020   Morbid obesity due to excess calories (HCC) 01/10/2016    History reviewed. No pertinent surgical history.   OB History     Gravida  0   Para  0   Term  0   Preterm  0   AB  0   Living  0      SAB  0   IAB  0   Ectopic  0   Multiple  0   Live Births              Family History  Problem Relation Age of Onset   Hypertension Mother     Social History   Tobacco Use   Smoking status: Never   Smokeless tobacco: Never  Vaping Use   Vaping Use: Never used  Substance Use Topics   Alcohol use: No    Alcohol/week: 0.0 standard drinks   Drug use: No    Home Medications Prior to Admission medications   Medication Sig Start Date End Date Taking? Authorizing Provider  meloxicam  (MOBIC) 15 MG tablet Take 1 tablet (15 mg total) by mouth daily. 06/16/21  Yes Latarra Eagleton H, PA-C  methocarbamol (ROBAXIN) 500 MG tablet Take 1 tablet (500 mg total) by mouth 2 (two) times daily. 06/16/21  Yes Cartez Mogle H, PA-C  albuterol (VENTOLIN HFA) 108 (90 Base) MCG/ACT inhaler Inhale 1-2 puffs into the lungs every 6 (six) hours as needed for wheezing or shortness of breath. 04/13/20   Lurene Shadow, PA-C    Allergies    Patient has no known allergies.  Review of Systems   Review of Systems  Musculoskeletal:  Positive for back pain.  All other systems reviewed and are negative.  Physical Exam Updated Vital Signs BP 120/75   Pulse 85   Temp 98.3 F (36.8 C) (Oral)   Resp 17   Ht 5\' 6"  (1.676 m)   Wt 122.5 kg   LMP 05/25/2021   SpO2 100%   BMI 43.58 kg/m   Physical Exam Vitals and nursing note reviewed.  Constitutional:      General: She is not in acute distress.    Appearance: Normal appearance.  HENT:  Head: Normocephalic and atraumatic.  Eyes:     General:        Right eye: No discharge.        Left eye: No discharge.  Cardiovascular:     Rate and Rhythm: Normal rate and regular rhythm.     Comments: Intact DP and PT pulses bilaterally. Pulmonary:     Effort: Pulmonary effort is normal. No respiratory distress.  Musculoskeletal:        General: No deformity.     Comments: No midline spinal tenderness, step-offs noted.  Significant paraspinal muscle tenderness on the right side.  No tenderness of the ASIS, PSIS, trochanteric bursa.  Strength 5 out of 5 in bilateral lower extremities.  Patient able to ambulate without difficulty, with some pain, minor limp.  Skin:    General: Skin is warm and dry.     Capillary Refill: Capillary refill takes less than 2 seconds.  Neurological:     Mental Status: She is alert and oriented to person, place, and time.  Psychiatric:        Mood and Affect: Mood normal.        Behavior: Behavior normal.    ED  Results / Procedures / Treatments   Labs (all labs ordered are listed, but only abnormal results are displayed) Labs Reviewed - No data to display  EKG None  Radiology No results found.  Procedures Procedures   Medications Ordered in ED Medications  ibuprofen (ADVIL) tablet 800 mg (800 mg Oral Given 06/16/21 2234)  acetaminophen (TYLENOL) tablet 650 mg (650 mg Oral Given 06/16/21 2234)    ED Course  I have reviewed the triage vital signs and the nursing notes.  Pertinent labs & imaging results that were available during my care of the patient were reviewed by me and considered in my medical decision making (see chart for details).    MDM Rules/Calculators/A&P                         Patient has no red flags for back pain including former IV drug use, corticosteroid use, history of cancer, fever, pain waking up patient from sleep.  Patient has mechanistic cause of low back strain.  Do not favor sciatica at this time, patient without any sciatica symptoms.  Patient has paraspinous muscle tenderness consistent with low back strain.  Recommend anti-inflammatory pain relief, Tylenol, muscle relaxant.  Recommend follow-up with orthopedics.  Recommend stretching, ice, rest.  Patient is neurovascularly intact, no signs of cauda equina including saddle anesthesia, difficulty with defecation or urination.  Patient given ibuprofen and Tylenol prior to discharge.  Patient is stable, return precautions given. Final Clinical Impression(s) / ED Diagnoses Final diagnoses:  Acute right-sided low back pain without sciatica    Rx / DC Orders ED Discharge Orders          Ordered    methocarbamol (ROBAXIN) 500 MG tablet  2 times daily        06/16/21 2223    meloxicam (MOBIC) 15 MG tablet  Daily        06/16/21 2223             Olene Floss, PA-C 06/16/21 2248    Charlynne Pander, MD 06/17/21 [phone]

## 2021-06-16 NOTE — Discharge Instructions (Addendum)
Encourage you to stretch your back muscles as much as he can when your pain is controlled.  Encourage you to follow-up with orthopedics.  Please return if your pain worsens or fails to improve especially if you start to have numbness or tingling, inability to walk.

## 2021-06-16 NOTE — ED Notes (Signed)
Pt A&Ox4 ambulatory at d/c with independent steady gait.  

## 2021-06-16 NOTE — ED Triage Notes (Signed)
Fall several weeks ago.  Right sided lower back pain. Increased pain with movement. Ambulatory. No distress noted

## 2021-06-18 ENCOUNTER — Telehealth: Payer: Self-pay | Admitting: General Practice

## 2021-06-18 NOTE — Telephone Encounter (Signed)
Transition Care Management Unsuccessful Follow-up Telephone Call  Date of discharge and from where:  06/16/21 from [facility]  Attempts:  1st Attempt  Reason for unsuccessful TCM follow-up call:  Left voice message

## 2021-06-20 NOTE — Telephone Encounter (Signed)
Transition Care Management Unsuccessful Follow-up Telephone Call  Date of discharge and from where:  06/16/21 from [facility]  Attempts:  2nd Attempt  Reason for unsuccessful TCM follow-up call:  Left voice message

## 2021-06-22 NOTE — Telephone Encounter (Signed)
Transition Care Management Unsuccessful Follow-up Telephone Call  Date of discharge and from where:  06/16/21 from [facility]  Attempts:  3rd Attempt  Reason for unsuccessful TCM follow-up call:  Left voice message
